# Patient Record
Sex: Female | Born: 1995 | Race: Black or African American | Hispanic: No | Marital: Single | Smoking: Never smoker
Health system: Southern US, Community
[De-identification: ages and names within clinical notes are randomized; demographics above are authoritative.]

## PROBLEM LIST (undated history)

## (undated) ENCOUNTER — Inpatient Hospital Stay (HOSPITAL_COMMUNITY): Payer: Self-pay

## (undated) DIAGNOSIS — Z87728 Personal history of other specified (corrected) congenital malformations of nervous system and sense organs: Secondary | ICD-10-CM

## (undated) DIAGNOSIS — J45909 Unspecified asthma, uncomplicated: Secondary | ICD-10-CM

## (undated) HISTORY — DX: Unspecified asthma, uncomplicated: J45.909

## (undated) HISTORY — DX: Personal history of other specified (corrected) congenital malformations of nervous system and sense organs: Z87.728

---
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
## 2015-03-24 ENCOUNTER — Encounter (HOSPITAL_COMMUNITY): Payer: Self-pay | Admitting: Emergency Medicine

## 2015-03-24 ENCOUNTER — Emergency Department (HOSPITAL_COMMUNITY)
Admission: EM | Admit: 2015-03-24 | Discharge: 2015-03-24 | Disposition: A | Discharge status: Home / Self Care | Payer: No Typology Code available for payment source | Attending: Emergency Medicine | Admitting: Emergency Medicine

## 2015-03-24 DIAGNOSIS — M545 Low back pain, unspecified: Secondary | ICD-10-CM

## 2015-03-24 DIAGNOSIS — Y998 Other external cause status: Secondary | ICD-10-CM | POA: Insufficient documentation

## 2015-03-24 DIAGNOSIS — S3992XA Unspecified injury of lower back, initial encounter: Secondary | ICD-10-CM | POA: Diagnosis not present

## 2015-03-24 DIAGNOSIS — Y92410 Unspecified street and highway as the place of occurrence of the external cause: Secondary | ICD-10-CM | POA: Insufficient documentation

## 2015-03-24 DIAGNOSIS — Z720 Tobacco use: Secondary | ICD-10-CM | POA: Insufficient documentation

## 2015-03-24 DIAGNOSIS — Y9389 Activity, other specified: Secondary | ICD-10-CM | POA: Insufficient documentation

## 2015-03-24 MED ORDER — IBUPROFEN 800 MG PO TABS
800.0000 mg | ORAL_TABLET | Freq: Once | ORAL | Status: AC
  Administered 2015-03-24: 800 mg via ORAL
  Filled 2015-03-24: qty 1

## 2015-03-24 MED ORDER — METHOCARBAMOL 500 MG PO TABS: 500.0000 mg | ORAL_TABLET | Freq: Two times a day (BID) | ORAL | Status: DC | PRN

## 2015-03-24 MED ORDER — NAPROXEN 250 MG PO TABS: 250.0000 mg | ORAL_TABLET | Freq: Two times a day (BID) | ORAL | Status: DC
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
## 2015-03-24 NOTE — ED Notes (Signed)
Pt c/o lower back pain, 7/10, denies numbness/tingling to same.
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
## 2020-07-10 NOTE — L&D Delivery Note (Signed)
Delivery Note Labor onset: 01/13/2021  Labor Onset Time: 0139 Complete dilation at 5:01 AM  FHR second stage Cat 1 that transitioned to 3, with terminal bradycardia Analgesia/Anesthesia intrapartum: Epidural  Guided pushing with strong maternal urge. Delivery of a viable female over mediolateral epis at [phone] to expedite delivery for terminal bradycardia. Fetal head delivered in LOA position.  Nuchal cord: none.  Meconium stained infant placed on maternal abd, dried, and tactile stim. Lusty cry noted w/ good tone. NICU team present for immediate eval, infant remained skin-to-skin with mother Cord double clamped after 2 min and cut by Father, Gerlean Ren.  Gerlean Ren present for birth.  Cord blood sample collected: Yes Arterial cord blood sample collected: artery collapsed, unable to collect  Placenta delivered Tomasa Blase, intact, with 3 VC.  Placenta to pathology. Uterine tone firm, bleeding small  Mediolateral epis repaired in standard fashion  Anesthesia: Epidural Repair: 2-0 Vicryl CT and 4-0 Vicryl SH QBL/EBL (mL): 250 Complications: terminal bradycardia APGAR: APGAR (1 MIN): 8   APGAR (5 MINS): 9   APGAR (10 MINS):   Mom to postpartum.  Baby to Couplet care / Skin to Skin. Baby Boy "[place]" parents request in-pt circ.   Roma Schanz MSN, CNM 01/13/2021, 7:40 AM

## 2020-07-30 DIAGNOSIS — Z3A16 16 weeks gestation of pregnancy: Secondary | ICD-10-CM | POA: Diagnosis not present

## 2020-07-30 DIAGNOSIS — N760 Acute vaginitis: Secondary | ICD-10-CM | POA: Diagnosis not present

## 2020-07-30 DIAGNOSIS — Z3402 Encounter for supervision of normal first pregnancy, second trimester: Secondary | ICD-10-CM | POA: Diagnosis not present

## 2020-07-30 DIAGNOSIS — Z113 Encounter for screening for infections with a predominantly sexual mode of transmission: Secondary | ICD-10-CM | POA: Diagnosis not present

## 2020-07-30 DIAGNOSIS — O3680X9 Pregnancy with inconclusive fetal viability, other fetus: Secondary | ICD-10-CM | POA: Diagnosis not present

## 2020-07-30 DIAGNOSIS — N926 Irregular menstruation, unspecified: Secondary | ICD-10-CM | POA: Diagnosis not present

## 2020-07-30 DIAGNOSIS — Z124 Encounter for screening for malignant neoplasm of cervix: Secondary | ICD-10-CM | POA: Diagnosis not present

## 2020-07-30 DIAGNOSIS — Z3482 Encounter for supervision of other normal pregnancy, second trimester: Secondary | ICD-10-CM | POA: Diagnosis not present

## 2020-07-30 LAB — OB RESULTS CONSOLE HEPATITIS B SURFACE ANTIGEN
Hepatitis B Surface Ag: NEGATIVE
Hepatitis B Surface Ag: NEGATIVE

## 2020-07-30 LAB — OB RESULTS CONSOLE RUBELLA ANTIBODY, IGM
Rubella: IMMUNE
Rubella: IMMUNE

## 2020-07-30 LAB — OB RESULTS CONSOLE HIV ANTIBODY (ROUTINE TESTING)
HIV: NONREACTIVE
HIV: NONREACTIVE

## 2020-07-30 LAB — OB RESULTS CONSOLE GC/CHLAMYDIA
Chlamydia: NEGATIVE
Gonorrhea: NEGATIVE

## 2020-07-30 LAB — OB RESULTS CONSOLE ABO/RH: RH Type: POSITIVE

## 2020-07-30 LAB — OB RESULTS CONSOLE ANTIBODY SCREEN: Antibody Screen: NEGATIVE

## 2020-07-30 LAB — OB RESULTS CONSOLE RPR: RPR: NONREACTIVE

## 2020-08-02 LAB — OB RESULTS CONSOLE GC/CHLAMYDIA
Chlamydia: NEGATIVE
Gonorrhea: NEGATIVE

## 2020-08-26 DIAGNOSIS — Z3492 Encounter for supervision of normal pregnancy, unspecified, second trimester: Secondary | ICD-10-CM | POA: Diagnosis not present

## 2020-08-26 DIAGNOSIS — Z3A20 20 weeks gestation of pregnancy: Secondary | ICD-10-CM | POA: Diagnosis not present

## 2020-08-26 DIAGNOSIS — Z369 Encounter for antenatal screening, unspecified: Secondary | ICD-10-CM | POA: Diagnosis not present

## 2020-08-26 DIAGNOSIS — O0932 Supervision of pregnancy with insufficient antenatal care, second trimester: Secondary | ICD-10-CM | POA: Diagnosis not present

## 2020-08-26 DIAGNOSIS — Z331 Pregnant state, incidental: Secondary | ICD-10-CM | POA: Diagnosis not present

## 2020-08-26 DIAGNOSIS — Z363 Encounter for antenatal screening for malformations: Secondary | ICD-10-CM | POA: Diagnosis not present

## 2020-10-21 DIAGNOSIS — Z3402 Encounter for supervision of normal first pregnancy, second trimester: Secondary | ICD-10-CM | POA: Diagnosis not present

## 2020-11-26 DIAGNOSIS — Z20822 Contact with and (suspected) exposure to covid-19: Secondary | ICD-10-CM | POA: Diagnosis not present

## 2020-12-14 DIAGNOSIS — Q056 Thoracic spina bifida without hydrocephalus: Secondary | ICD-10-CM | POA: Diagnosis not present

## 2020-12-14 DIAGNOSIS — O0932 Supervision of pregnancy with insufficient antenatal care, second trimester: Secondary | ICD-10-CM | POA: Diagnosis not present

## 2020-12-14 DIAGNOSIS — Z113 Encounter for screening for infections with a predominantly sexual mode of transmission: Secondary | ICD-10-CM | POA: Diagnosis not present

## 2020-12-14 DIAGNOSIS — Z331 Pregnant state, incidental: Secondary | ICD-10-CM | POA: Diagnosis not present

## 2020-12-14 DIAGNOSIS — Z369 Encounter for antenatal screening, unspecified: Secondary | ICD-10-CM | POA: Diagnosis not present

## 2020-12-14 LAB — OB RESULTS CONSOLE GBS: GBS: NEGATIVE

## 2020-12-27 DIAGNOSIS — Z3A38 38 weeks gestation of pregnancy: Secondary | ICD-10-CM | POA: Diagnosis not present

## 2020-12-27 DIAGNOSIS — O321XX9 Maternal care for breech presentation, other fetus: Secondary | ICD-10-CM | POA: Diagnosis not present

## 2021-01-11 ENCOUNTER — Other Ambulatory Visit: Payer: Self-pay | Admitting: Obstetrics and Gynecology

## 2021-01-12 ENCOUNTER — Encounter (HOSPITAL_COMMUNITY): Payer: Self-pay | Admitting: Obstetrics and Gynecology

## 2021-01-12 ENCOUNTER — Encounter (HOSPITAL_COMMUNITY): Payer: Self-pay | Admitting: Anesthesiology

## 2021-01-12 ENCOUNTER — Inpatient Hospital Stay (HOSPITAL_COMMUNITY): Payer: Medicaid Other | Admitting: Anesthesiology

## 2021-01-12 ENCOUNTER — Inpatient Hospital Stay (HOSPITAL_COMMUNITY)
Admission: RE | Admit: 2021-01-12 | Discharge: 2021-01-15 | DRG: 807 | Disposition: A | Discharge status: Home / Self Care | Payer: Medicaid Other | Attending: Obstetrics & Gynecology | Admitting: Obstetrics & Gynecology

## 2021-01-12 ENCOUNTER — Telehealth (HOSPITAL_COMMUNITY): Payer: Self-pay | Admitting: *Deleted

## 2021-01-12 ENCOUNTER — Encounter (HOSPITAL_COMMUNITY): Payer: Self-pay | Admitting: *Deleted

## 2021-01-12 DIAGNOSIS — O9952 Diseases of the respiratory system complicating childbirth: Secondary | ICD-10-CM | POA: Diagnosis not present

## 2021-01-12 DIAGNOSIS — Z3A40 40 weeks gestation of pregnancy: Secondary | ICD-10-CM | POA: Diagnosis not present

## 2021-01-12 DIAGNOSIS — O99334 Smoking (tobacco) complicating childbirth: Secondary | ICD-10-CM | POA: Diagnosis not present

## 2021-01-12 DIAGNOSIS — O99892 Other specified diseases and conditions complicating childbirth: Principal | ICD-10-CM | POA: Diagnosis present

## 2021-01-12 DIAGNOSIS — F17210 Nicotine dependence, cigarettes, uncomplicated: Secondary | ICD-10-CM | POA: Diagnosis not present

## 2021-01-12 DIAGNOSIS — Q760 Spina bifida occulta: Secondary | ICD-10-CM

## 2021-01-12 DIAGNOSIS — O99214 Obesity complicating childbirth: Secondary | ICD-10-CM | POA: Diagnosis present

## 2021-01-12 DIAGNOSIS — J45909 Unspecified asthma, uncomplicated: Secondary | ICD-10-CM | POA: Diagnosis not present

## 2021-01-12 DIAGNOSIS — O26893 Other specified pregnancy related conditions, third trimester: Secondary | ICD-10-CM | POA: Diagnosis not present

## 2021-01-12 LAB — CBC
HCT: 35.4 % — ABNORMAL LOW (ref 36.0–46.0)
Hemoglobin: 11.8 g/dL — ABNORMAL LOW (ref 12.0–15.0)
MCH: 27.3 pg (ref 26.0–34.0)
MCHC: 33.3 g/dL (ref 30.0–36.0)
MCV: 81.8 fL (ref 80.0–100.0)
Platelets: 272 10*3/uL (ref 150–400)
RBC: 4.33 MIL/uL (ref 3.87–5.11)
RDW: 14.3 % (ref 11.5–15.5)
WBC: 7.7 10*3/uL (ref 4.0–10.5)
nRBC: 0 % (ref 0.0–0.2)

## 2021-01-12 LAB — TYPE AND SCREEN
ABO/RH(D): A POS
Antibody Screen: NEGATIVE

## 2021-01-12 MED ORDER — FLEET ENEMA 7-19 GM/118ML RE ENEM: 1.0000 | ENEMA | RECTAL | Status: DC | PRN

## 2021-01-12 MED ORDER — PHENYLEPHRINE 40 MCG/ML (10ML) SYRINGE FOR IV PUSH (FOR BLOOD PRESSURE SUPPORT): 80.0000 ug | PREFILLED_SYRINGE | INTRAVENOUS | Status: DC | PRN

## 2021-01-12 MED ORDER — ONDANSETRON HCL 4 MG/2ML IJ SOLN
4.0000 mg | Freq: Four times a day (QID) | INTRAMUSCULAR | Status: DC | PRN
  Administered 2021-01-12: 4 mg via INTRAVENOUS
  Filled 2021-01-12: qty 2

## 2021-01-12 MED ORDER — LACTATED RINGERS IV SOLN
500.0000 mL | Freq: Once | INTRAVENOUS | Status: AC
  Administered 2021-01-12: 500 mL via INTRAVENOUS

## 2021-01-12 MED ORDER — SOD CITRATE-CITRIC ACID 500-334 MG/5ML PO SOLN: 30.0000 mL | ORAL | Status: DC | PRN

## 2021-01-12 MED ORDER — LACTATED RINGERS IV SOLN: 500.0000 mL | INTRAVENOUS | Status: DC | PRN

## 2021-01-12 MED ORDER — ACETAMINOPHEN 325 MG PO TABS: 650.0000 mg | ORAL_TABLET | ORAL | Status: DC | PRN

## 2021-01-12 MED ORDER — LACTATED RINGERS IV SOLN: INTRAVENOUS | Status: DC

## 2021-01-12 MED ORDER — OXYCODONE-ACETAMINOPHEN 5-325 MG PO TABS
2.0000 | ORAL_TABLET | ORAL | Status: DC | PRN
Start: 2021-01-12 — End: 2021-01-13

## 2021-01-12 MED ORDER — PHENYLEPHRINE 40 MCG/ML (10ML) SYRINGE FOR IV PUSH (FOR BLOOD PRESSURE SUPPORT)
80.0000 ug | PREFILLED_SYRINGE | INTRAVENOUS | Status: DC | PRN
  Filled 2021-01-12: qty 10

## 2021-01-12 MED ORDER — OXYTOCIN BOLUS FROM INFUSION
333.0000 mL | Freq: Once | INTRAVENOUS | Status: AC
  Administered 2021-01-13: 333 mL via INTRAVENOUS

## 2021-01-12 MED ORDER — LIDOCAINE HCL (PF) 1 % IJ SOLN: 30.0000 mL | INTRAMUSCULAR | Status: DC | PRN

## 2021-01-12 MED ORDER — DIPHENHYDRAMINE HCL 50 MG/ML IJ SOLN
12.5000 mg | INTRAMUSCULAR | Status: DC | PRN
  Administered 2021-01-13: 12.5 mg via INTRAVENOUS
  Filled 2021-01-12: qty 1

## 2021-01-12 MED ORDER — EPHEDRINE 5 MG/ML INJ: 10.0000 mg | INTRAVENOUS | Status: DC | PRN

## 2021-01-12 MED ORDER — OXYCODONE-ACETAMINOPHEN 5-325 MG PO TABS
1.0000 | ORAL_TABLET | ORAL | Status: DC | PRN
Start: 2021-01-12 — End: 2021-01-13

## 2021-01-12 MED ORDER — FENTANYL-BUPIVACAINE-NACL 0.5-0.125-0.9 MG/250ML-% EP SOLN
12.0000 mL/h | EPIDURAL | Status: DC | PRN
  Administered 2021-01-12: 12 mL/h via EPIDURAL
  Filled 2021-01-12: qty 250

## 2021-01-12 MED ORDER — OXYTOCIN-SODIUM CHLORIDE 30-0.9 UT/500ML-% IV SOLN
2.5000 [IU]/h | INTRAVENOUS | Status: DC
  Filled 2021-01-12: qty 500

## 2021-01-12 NOTE — MAU Note (Addendum)
Patient presented to [facility] with ctx reported starting 1400 today 7/6. States that they are currently 3-5 mins, with increased intensity 7-8/10 pain rating. Patient reports also lost her mucus plug yesterday 7/5. Denies VB, LOF, and DFM.

## 2021-01-12 NOTE — H&P (Addendum)
OB ADMISSION/ HISTORY & PHYSICAL:  Admission Date: 01/12/2021  7:38 PM  Admit Diagnosis: Normal labor  Caroline Ramos is a 25 y.o. female G1P0 [redacted]w[redacted]d presenting for labor eval. Endorses active FM, denies LOF and vaginal bleeding. Ctx began @ 1400 and continued to worsen. Pt sched for IOL @ 41 wks, will admit for labor.   History of current pregnancy: G1P0   Patient entered care with [facility] at 16+6 wks.   EDC 01/08/21 by LMP and congruent w/ 16+2 wk U/S.   Anatomy scan:  20+5 wks, complete w/ posterior placenta.   Antenatal testing: N/A Last evaluation: 38  wks U/S for presentation--vertex and AFI 18.5  Significant prenatal events:  Patient Active Problem List   Diagnosis Date Noted   Normal labor 01/12/2021   SBO (spina bifida occulta) 01/12/2021    Prenatal Labs: ABO, Rh: A/Positive/-- (01/21 0000) Antibody: Negative (01/21 0000) Rubella: Immune (01/21 0000)  RPR: Nonreactive (01/21 0000)  HBsAg: Negative (01/21 0000)  HIV: Non-reactive (01/21 0000)  GTT: passed 1 hr GBS:   neg GC/CHL: neg/neg Genetics: low-risk female, negative Horizon Tdap/influenza vaccines: declined both   OB History  Gravida Para Term Preterm AB Living  1            SAB IAB Ectopic Multiple Live Births               # Outcome Date GA Lbr Len/2nd Weight Sex Delivery Anes PTL Lv  1 Current             Medical / Surgical History: Past medical history:  Past Medical History:  Diagnosis Date   Asthma    History of spina bifida     Past surgical history: History reviewed. No pertinent surgical history. Family History:  Family History  Problem Relation Age of Onset   Breast cancer Mother    Hypertension Maternal Grandmother    Liver disease Maternal Grandmother     Social History:  reports that she has been smoking cigarettes. She has never used smokeless tobacco. She reports current alcohol use. She reports current drug use. Drug: Marijuana.  Allergies: Patient has no known allergies.    Current Medications at time of admission:  Prior to Admission medications   Medication Sig Start Date End Date Taking? Authorizing Provider  Prenatal Vit-Fe Fumarate-FA (PRENATAL MULTIVITAMIN) TABS tablet Take 1 tablet by mouth daily at 12 noon.   Yes [provider]  methocarbamol (ROBAXIN) 500 MG tablet Take 1 tablet (500 mg total) by mouth 2 (two) times daily as needed for muscle spasms. 03/24/15   Everlene Farrier, PA-C  naproxen (NAPROSYN) 250 MG tablet Take 1 tablet (250 mg total) by mouth 2 (two) times daily with a meal. 03/24/15   Everlene Farrier, PA-C    Review of Systems: Constitutional: Negative   HENT: Negative   Eyes: Negative   Respiratory: Negative   Cardiovascular: Negative   Gastrointestinal: Negative  Genitourinary: neg for bloody show, neg for LOF   Musculoskeletal: Negative   Skin: Negative   Neurological: Negative   Endo/Heme/Allergies: Negative   Psychiatric/Behavioral: Negative    Physical Exam: VS: Blood pressure 136/79, pulse 98, temperature 98.9 F (37.2 C), temperature source Oral, resp. rate 18, SpO2 100 %. AAO x3, no signs of distress Cardiovascular: RRR Respiratory: Lung fields clear to ausculation GU/GI: Abdomen gravid, non-tender, non-distended, active FM, vertex, EFW 7.5# per Leopold's Extremities: trace edema, negative for pain, tenderness, and cords  Cervical exam:Dilation: 2.5 Effacement (%): 90 Station: -2 Exam  by:: Burnadette Peter, RN FHR: baseline rate 135 / variability moderate / accelerations present / absent decelerations TOCO: 2-5 min   Prenatal Transfer Tool  Maternal Diabetes: No Genetic Screening: Normal Maternal Ultrasounds/Referrals: Normal Fetal Ultrasounds or other Referrals:  None Maternal Substance Abuse:  No Significant Maternal Medications:  None Significant Maternal Lab Results: Group B Strep negative    Assessment: 25 y.o. G1P0 [redacted]w[redacted]d  Latent stage of labor FHR category 1 GBS neg Pain management  plan: Epidural   Plan:  Admit to L&D Routine admission orders Epidural PRN  Dr Richardson Dopp notified of admission and plan of care  Roma Schanz MSN, CNM 01/12/2021 9:07 PM

## 2021-01-12 NOTE — Telephone Encounter (Signed)
Preadmission screen  

## 2021-01-13 ENCOUNTER — Other Ambulatory Visit: Payer: Self-pay

## 2021-01-13 ENCOUNTER — Encounter (HOSPITAL_COMMUNITY): Payer: Self-pay | Admitting: Obstetrics & Gynecology

## 2021-01-13 ENCOUNTER — Other Ambulatory Visit (HOSPITAL_COMMUNITY): Payer: Medicaid Other | Attending: Obstetrics and Gynecology

## 2021-01-13 DIAGNOSIS — Z3A40 40 weeks gestation of pregnancy: Secondary | ICD-10-CM | POA: Diagnosis not present

## 2021-01-13 LAB — RPR: RPR Ser Ql: NONREACTIVE

## 2021-01-13 MED ORDER — DIPHENHYDRAMINE HCL 50 MG/ML IJ SOLN
12.5000 mg | INTRAMUSCULAR | Status: DC | PRN
Start: 2021-01-13 — End: 2021-01-13

## 2021-01-13 MED ORDER — IBUPROFEN 600 MG PO TABS
600.0000 mg | ORAL_TABLET | Freq: Four times a day (QID) | ORAL | Status: DC
  Administered 2021-01-13 – 2021-01-15 (×8): 600 mg via ORAL
  Filled 2021-01-13 (×8): qty 1

## 2021-01-13 MED ORDER — ZOLPIDEM TARTRATE 5 MG PO TABS: 5.0000 mg | ORAL_TABLET | Freq: Every evening | ORAL | Status: DC | PRN

## 2021-01-13 MED ORDER — DIPHENHYDRAMINE HCL 25 MG PO CAPS: 25.0000 mg | ORAL_CAPSULE | Freq: Four times a day (QID) | ORAL | Status: DC | PRN

## 2021-01-13 MED ORDER — SIMETHICONE 80 MG PO CHEW: 80.0000 mg | CHEWABLE_TABLET | ORAL | Status: DC | PRN

## 2021-01-13 MED ORDER — ONDANSETRON HCL 4 MG/2ML IJ SOLN: 4.0000 mg | INTRAMUSCULAR | Status: DC | PRN

## 2021-01-13 MED ORDER — ACETAMINOPHEN 325 MG PO TABS
650.0000 mg | ORAL_TABLET | ORAL | Status: DC | PRN
  Administered 2021-01-14 – 2021-01-15 (×5): 650 mg via ORAL
  Filled 2021-01-13 (×5): qty 2

## 2021-01-13 MED ORDER — TETANUS-DIPHTH-ACELL PERTUSSIS 5-2.5-18.5 LF-MCG/0.5 IM SUSY: 0.5000 mL | PREFILLED_SYRINGE | Freq: Once | INTRAMUSCULAR | Status: DC

## 2021-01-13 MED ORDER — DIBUCAINE (PERIANAL) 1 % EX OINT: 1.0000 "application " | TOPICAL_OINTMENT | CUTANEOUS | Status: DC | PRN

## 2021-01-13 MED ORDER — SENNOSIDES-DOCUSATE SODIUM 8.6-50 MG PO TABS
2.0000 | ORAL_TABLET | ORAL | Status: DC
  Administered 2021-01-13 – 2021-01-14 (×2): 2 via ORAL
  Filled 2021-01-13 (×2): qty 2

## 2021-01-13 MED ORDER — PRENATAL MULTIVITAMIN CH
1.0000 | ORAL_TABLET | Freq: Every day | ORAL | Status: DC
  Administered 2021-01-13 – 2021-01-14 (×2): 1 via ORAL
  Filled 2021-01-13 (×2): qty 1

## 2021-01-13 MED ORDER — COCONUT OIL OIL
1.0000 "application " | TOPICAL_OIL | Status: DC | PRN
  Administered 2021-01-13: 1 via TOPICAL

## 2021-01-13 MED ORDER — FENTANYL-BUPIVACAINE-NACL 0.5-0.125-0.9 MG/250ML-% EP SOLN: 12.0000 mL/h | EPIDURAL | Status: DC | PRN

## 2021-01-13 MED ORDER — BENZOCAINE-MENTHOL 20-0.5 % EX AERO
1.0000 "application " | INHALATION_SPRAY | CUTANEOUS | Status: DC | PRN
  Administered 2021-01-14 (×2): 1 via TOPICAL
  Filled 2021-01-13 (×2): qty 56

## 2021-01-13 MED ORDER — ONDANSETRON HCL 4 MG PO TABS: 4.0000 mg | ORAL_TABLET | ORAL | Status: DC | PRN

## 2021-01-13 MED ORDER — LIDOCAINE HCL (PF) 1 % IJ SOLN
INTRAMUSCULAR | Status: DC | PRN
  Administered 2021-01-12: 8 mL via EPIDURAL

## 2021-01-13 MED ORDER — WITCH HAZEL-GLYCERIN EX PADS
1.0000 "application " | MEDICATED_PAD | CUTANEOUS | Status: DC | PRN
  Administered 2021-01-14: 1 via TOPICAL

## 2021-01-13 NOTE — Progress Notes (Signed)
Subjective:    Comfortable w/ epidural  Objective:    VS: BP 124/72   Pulse (!) 109   Temp 98.2 F (36.8 C) (Oral)   Resp 15   SpO2 98%  FHR : baseline 135 / variability moderate / accelerations present / absent decelerations Toco: contractions every 3-4 minutes  Membranes: AROM, clear Dilation: 8 Effacement (%): 90 Cervical Position: Posterior Station: 0 Presentation: Vertex Exam by:: Rhea Pink, CNM   Assessment/Plan:   25 y.o. G1P0 [redacted]w[redacted]d Spontaneous labor  Labor: Progressing normally Fetal Wellbeing:  Category I Pain Control:  Epidural I/D:   GBS neg Anticipated MOD:  NSVD  Roma Schanz MSN, CNM 01/13/2021 1:58 AM

## 2021-01-13 NOTE — Lactation Note (Signed)
This note was copied from a baby's chart. Lactation Consultation Note Attempted to see mom. RN stated mom is still being repaired.  Patient Name: Caroline Ramos [id number] Date: 01/13/2021   Age:25 hours  Maternal Data    Feeding    LATCH Score                    Lactation Tools Discussed/Used    Interventions    Discharge    Consult Status      Charyl Dancer 01/13/2021, 6:51 AM

## 2021-01-13 NOTE — Lactation Note (Signed)
This note was copied from a baby's chart. Lactation Consultation Note  Patient Name: Caroline Ramos [id number] Date: 01/13/2021 Reason for consult: Follow-up assessment Age:25 hours  P1, Baby cueing.  Assisted with latching in football position. Reviewed hand expression with good flow. Mother brought her personal pump flange and requested fitting.  Fitted size with manual pump to 27. Suggest checking once milk transitions. Answered questions.  Mother requested coconut oil which was provided. Mom made aware of O/P services, breastfeeding support groups, community resources, and our phone # for post-discharge questions.  Feed on demand with cues.  Goal 8-12+ times per day after first 24 hrs.  Place baby STS if not cueing.    Maternal Data Has patient been taught Hand Expression?: Yes Does the patient have breastfeeding experience prior to this delivery?: No  Feeding Mother's Current Feeding Choice: Breast Milk  LATCH Score Latch: Grasps breast easily, tongue down, lips flanged, rhythmical sucking.  Audible Swallowing: A few with stimulation  Type of Nipple: Everted at rest and after stimulation  Comfort (Breast/Nipple): Soft / non-tender  Hold (Positioning): Assistance needed to correctly position infant at breast and maintain latch.  LATCH Score: 8   Lactation Tools Discussed/Used Tools: Pump Breast pump type: Manual Reason for Pumping:  (flange fitting)  Interventions Interventions: Breast feeding basics reviewed;Assisted with latch;Skin to skin;Hand express;Breast compression;Adjust position;Support pillows;Position options;Coconut oil;Hand pump;Education  Discharge Pump: DEBP  Consult Status Consult Status: Follow-up Date: 01/14/21 Follow-up type: In-patient    Dahlia Byes [facility] 01/13/2021, 10:42 AM

## 2021-01-13 NOTE — Anesthesia Preprocedure Evaluation (Signed)
Anesthesia Evaluation  Patient identified by MRN, date of birth, ID band Patient awake    Reviewed: Allergy & Precautions, H&P , NPO status , Patient's Chart, lab work & pertinent test results, reviewed documented beta blocker date and time   Airway Mallampati: II  TM Distance: >3 FB Neck ROM: full    Dental no notable dental hx. (+) Teeth Intact, Dental Advisory Given   Pulmonary asthma ,    Pulmonary exam normal breath sounds clear to auscultation       Cardiovascular negative cardio ROS Normal cardiovascular exam Rhythm:regular Rate:Normal     Neuro/Psych negative neurological ROS  negative psych ROS   GI/Hepatic negative GI ROS, Neg liver ROS,   Endo/Other  Morbid obesity  Renal/GU negative Renal ROS  negative genitourinary   Musculoskeletal   Abdominal   Peds  Hematology negative hematology ROS (+)   Anesthesia Other Findings SBO (spina bifida occulta)  Reproductive/Obstetrics (+) Pregnancy                             Anesthesia Physical Anesthesia Plan  ASA: 3  Anesthesia Plan: Epidural   Post-op Pain Management:    Induction:   PONV Risk Score and Plan: 2 and Treatment may vary due to age or medical condition  Airway Management Planned: Natural Airway  Additional Equipment: None  Intra-op Plan:   Post-operative Plan:   Informed Consent: I have reviewed the patients History and Physical, chart, labs and discussed the procedure including the risks, benefits and alternatives for the proposed anesthesia with the patient or authorized representative who has indicated his/her understanding and acceptance.       Plan Discussed with: Anesthesiologist  Anesthesia Plan Comments:         Anesthesia Quick Evaluation

## 2021-01-13 NOTE — Lactation Note (Signed)
This note was copied from a baby's chart. Lactation Consultation Note  Patient Name: Caroline Ramos [id number] Date: 01/13/2021 Reason for consult: L&D Initial assessment Age:25 hours Baby cueing.  Baby latched with ease. Lactation will follow up on MBU. Feeding Mother's Current Feeding Choice: Breast Milk  LATCH Score Latch: Grasps breast easily, tongue down, lips flanged, rhythmical sucking.  Audible Swallowing: A few with stimulation  Type of Nipple: Everted at rest and after stimulation  Comfort (Breast/Nipple): Soft / non-tender  Hold (Positioning): Assistance needed to correctly position infant at breast and maintain latch.  LATCH Score: 8   Interventions Interventions: Assisted with latch;Skin to skin;Education  Consult Status Consult Status: Follow-up Date: 01/13/21 Follow-up type: In-patient    Dahlia Byes [facility] 01/13/2021, 7:38 AM

## 2021-01-13 NOTE — Anesthesia Procedure Notes (Signed)
Epidural Patient location during procedure: OB Start time: 01/12/2021 11:50 PM End time: 01/12/2021 11:55 PM  Staffing Anesthesiologist: Bethena Midget, MD  Preanesthetic Checklist Completed: patient identified, IV checked, site marked, risks and benefits discussed, surgical consent, monitors and equipment checked, pre-op evaluation and timeout performed  Epidural Patient position: sitting Prep: DuraPrep and site prepped and draped Patient monitoring: continuous pulse ox and blood pressure Approach: midline Location: L4-L5 Injection technique: LOR air  Needle:  Needle type: Tuohy  Needle gauge: 17 G Needle length: 9 cm and 9 Needle insertion depth: 8 cm Catheter type: closed end flexible Catheter size: 19 Gauge Catheter at skin depth: 13 cm Test dose: negative  Assessment Events: blood not aspirated, injection not painful, no injection resistance, no paresthesia and negative IV test

## 2021-01-13 NOTE — Anesthesia Postprocedure Evaluation (Signed)
Anesthesia Post Note  Patient: Caroline Ramos  Procedure(s) Performed: AN AD HOC LABOR EPIDURAL     Patient location during evaluation: Mother Baby Anesthesia Type: Epidural Level of consciousness: awake, awake and alert and oriented Pain management: pain level controlled Vital Signs Assessment: post-procedure vital signs reviewed and stable Respiratory status: spontaneous breathing, respiratory function stable and nonlabored ventilation Cardiovascular status: blood pressure returned to baseline and stable Postop Assessment: no headache, no backache, epidural receding, no apparent nausea or vomiting, able to ambulate and adequate PO intake Anesthetic complications: no   No notable events documented.  Last Vitals:  Vitals:   01/13/21 1317 01/13/21 1754  BP: 122/75 123/73  Pulse: (!) 102 (!) 111  Resp: 18 18  Temp: 36.7 C 36.9 C  SpO2:      Last Pain:  Vitals:   01/13/21 1930  TempSrc:   PainSc: 0-No pain   Pain Goal:                   Jennelle Human

## 2021-01-14 LAB — CBC
HCT: 31 % — ABNORMAL LOW (ref 36.0–46.0)
Hemoglobin: 10.1 g/dL — ABNORMAL LOW (ref 12.0–15.0)
MCH: 26.6 pg (ref 26.0–34.0)
MCHC: 32.6 g/dL (ref 30.0–36.0)
MCV: 81.8 fL (ref 80.0–100.0)
Platelets: 258 10*3/uL (ref 150–400)
RBC: 3.79 MIL/uL — ABNORMAL LOW (ref 3.87–5.11)
RDW: 14.5 % (ref 11.5–15.5)
WBC: 11.3 10*3/uL — ABNORMAL HIGH (ref 4.0–10.5)
nRBC: 0 % (ref 0.0–0.2)

## 2021-01-14 LAB — SURGICAL PATHOLOGY

## 2021-01-14 NOTE — Lactation Note (Signed)
This note was copied from a baby's chart. Lactation Consultation Note  Patient Name: Caroline Ramos [id number] Date: 01/14/2021 Reason for consult: Follow-up assessment Age:25 hours P1, Baby latched upon entering.  {Per parents stools have transitioned to green. Mother's states that her L nipple is tender.  No cracks or abrasions noted. Assisted with guiding baby deeper on breast and mother felt improved comfort. Provided mother with comfort gels for soothing after breastfeeding.   Answered questions and reviewed engorgement care.   Latch Latch: Grasps breast easily, tongue down, lips flanged, rhythmical sucking.  Audible Swallowing: A few with stimulation  Type of Nipple: Everted at rest and after stimulation  Comfort (Breast/Nipple): Filling, red/small blisters or bruises, mild/mod discomfort  Hold (Positioning): No assistance needed to correctly position infant at breast.  LATCH Score: 8   Lactation Tools Discussed/Used Tools: Pump;Comfort gels;Coconut oil  Interventions Interventions: Breast feeding basics reviewed;Assisted with latch;Comfort gels  Discharge Discharge Education: Engorgement and breast care Pump: DEBP  Consult Status Consult Status: Follow-up Date: 01/15/21 Follow-up type: In-patient    Dahlia Byes [facility] 01/14/2021, 9:53 AM

## 2021-01-14 NOTE — Progress Notes (Signed)
PPD# 1 SVD w/ mediolateral epis Information for the patient's newborn:  Olanda, Boughner [[id number]]  female   Baby Boy: Angus Palms Circumcision: desires in pt   S:   Reports feeling "good, a little sore" Tolerating PO fluid and solids No nausea or vomiting Bleeding is light Pain controlled with acetaminophen and ibuprofen (OTC) Up ad lib / ambulatory / voiding w/o difficulty Feeding: Breast    O:   VS: BP 114/66   Pulse 96   Temp 98.2 F (36.8 C) (Oral)   Resp 18   Ht 5\' 4"  (1.626 m)   Wt 99.8 kg   SpO2 100%   Breastfeeding Unknown   BMI 37.76 kg/m   LABS:  Recent Labs    01/12/21 2108 01/14/21 0547  WBC 7.7 11.3*  HGB 11.8* 10.1*  PLT 272 258   Blood type: --/--/A POS (07/06 2135) Rubella: Immune, Immune (01/21 0000)                      I&O: Intake/Output      07/07 0701 07/08 0700 07/08 0701 07/09 0700   I.V. (mL/kg) 58.4 (0.6)    Other 182    Total Intake(mL/kg) 240.4 (2.4)    Urine (mL/kg/hr)     Blood     Total Output     Net +240.4         Urine Occurrence 2 x      Physical Exam: Alert and oriented X3 Lungs: Clear and unlabored Heart: regular rate and rhythm / no mumurs Abdomen: soft, non-tender, non-distended  Fundus: firm, non-tender, U-1 Perineum: mediolateral epis well approximated Lochia: minimal Extremities: no edema, no calf pain or tenderness    A:  PPD # 1  Normal exam  P:  Routine post partum orders  Anticipate D/C on 01/15/21   Plan reviewed w/ Dr. 03/18/21, MSN, CNM 01/14/2021, 10:34 AM

## 2021-01-14 NOTE — Lactation Note (Addendum)
This note was copied from a baby's chart. Lactation Consultation Note  Patient Name: Caroline Ramos [id number] Date: 01/14/2021 Reason for consult: Follow-up assessment;Term Age:25 hours, term female infant, -3% weight loss. Infant had 4 voids and 4 stools. Per mom, infant recently BF for 25 minutes 1 hour prior to [facility] entering the room.  Mom feels breastfeeding is going well, infant is now breastfeeding 20 to 30 minutes most feeding, she is feeling on a tug with latch. Mom does have some soreness but no trauma noted on either breast. Mom will continue to BF infant according to feeding cues, infant has been cluster feeding today. LC reviewed hand expression and mom easily expressed 3 mls of colostrum , she plans to offer to infant after she breastfeed him at the next feeding by spoon.    Maternal Data    Feeding Mother's Current Feeding Choice: Breast Milk  LATCH Score                    Lactation Tools Discussed/Used    Interventions Interventions: Skin to skin;Hand express;Breast massage;Education;Expressed milk;Comfort gels  Discharge    Consult Status Consult Status: Follow-up Date: 01/15/21 Follow-up type: In-patient    Danelle Earthly 01/14/2021, 10:14 PM

## 2021-01-15 ENCOUNTER — Inpatient Hospital Stay (HOSPITAL_COMMUNITY): Payer: Medicaid Other

## 2021-01-15 ENCOUNTER — Inpatient Hospital Stay (HOSPITAL_COMMUNITY)
Admission: AD | Admit: 2021-01-15 | Payer: Medicaid Other | Source: Home / Self Care | Admitting: Obstetrics and Gynecology

## 2021-01-15 MED ORDER — IBUPROFEN 600 MG PO TABS: 600.0000 mg | ORAL_TABLET | Freq: Four times a day (QID) | ORAL | 0 refills | Status: DC

## 2021-01-15 NOTE — Lactation Note (Signed)
This note was copied from a baby's chart. Lactation Consultation Note  Patient Name: Caroline Ramos Date: 01/15/2021 Reason for consult: Follow-up assessment;Primapara;1st time breastfeeding Age:25 hours/ 3 % weight loss / serum Bilirubin at 0722 was 9.8 L/I .  Per mom the baby has been cluster feeding along with longer feedings.  Per mom breast are heavier. LC reassured mom that was a good sign.  Latch score below is from the [facility] early this shift.  LC reviewed the doc flow sheets/ already had been updated.  D/C BF teaching completed below.  LC provided the [facility] brochure with resources for BF PP.   Maternal Data Has patient been taught Hand Expression?: Yes  Feeding Mother's Current Feeding Choice: Breast Milk  LATCH Score - ( Latch by the [facility] )  Latch: Grasps breast easily, tongue down, lips flanged, rhythmical sucking.  Audible Swallowing: Spontaneous and intermittent  Type of Nipple: Everted at rest and after stimulation  Comfort (Breast/Nipple): Filling, red/small blisters or bruises, mild/mod discomfort (comfort gels)  Hold (Positioning): No assistance needed to correctly position infant at breast.  LATCH Score: 9   Lactation Tools Discussed/Used Tools: Shells;Comfort gels;Pump;Flanges Flange Size: 24;27 Breast pump type: Manual Pump Education: Milk Storage  Interventions Interventions: Breast feeding basics reviewed;Education  Discharge Discharge Education: Engorgement and breast care;Warning signs for feeding baby Pump: Personal;Manual;DEBP  Consult Status Consult Status: Complete Date: 01/15/21    Kathrin Greathouse 01/15/2021, 9:56 AM

## 2021-01-15 NOTE — Discharge Summary (Signed)
SVD OB Discharge Summary     Patient Name: Caroline Ramos DOB: Jul 01, 1996 MRN: [id number]  Date of admission: 01/12/2021 Delivering MD: Rhea Pink B  Date of delivery: 01/13/2021 Type of delivery: SVD  Newborn Data: Sex: Baby female Circumcision: done in pt  Live born female  Birth Weight: 7 lb 4.6 oz (3306 g) APGAR: 8, 9  Newborn Delivery   Birth date/time: 01/13/2021 06:16:00 Delivery type: Vaginal, Spontaneous      Feeding: breast Infant being discharge to home with mother in stable condition.   Admitting diagnosis: Normal labor [O80, Z37.9] Intrauterine pregnancy: [redacted]w[redacted]d     Secondary diagnosis:  Principal Problem:   SVD (7/7) Active Problems:   Normal labor   SBO (spina bifida occulta)   Normal postpartum course                                Complications: None                                                              Intrapartum Procedures: spontaneous vaginal delivery Postpartum Procedures: none Complications-Operative and Postpartum:  medio lateral episiotomy.  Augmentation: AROM   History of Present Illness: [name] is a 25 y.o. female, G1P1001, who presents at [redacted]w[redacted]d weeks gestation. The patient has been followed at  [facility]  Her pregnancy has been complicated by:  Patient Active Problem List   Diagnosis Date Noted   Normal postpartum course 01/15/2021   SVD (7/7) 01/13/2021   Normal labor 01/12/2021   SBO (spina bifida occulta) 01/12/2021     Active Ambulatory Problems    Diagnosis Date Noted   No Active Ambulatory Problems   Resolved Ambulatory Problems    Diagnosis Date Noted   No Resolved Ambulatory Problems   Past Medical History:  Diagnosis Date   Asthma    History of spina bifida      Hospital course:  Onset of Labor With Vaginal Delivery      25 y.o. yo G1P1001 at [redacted]w[redacted]d was admitted in Latent Labor on 01/12/2021. Patient had an uncomplicated labor course as follows:  Membrane Rupture  Time/Date: 1:39 AM ,01/13/2021   Delivery Method:Vaginal, Spontaneous  Episiotomy: Median  Lacerations:  None  Patient had an uncomplicated postpartum course.  She is ambulating, tolerating a regular diet, passing flatus, and urinating well. Patient is discharged home in stable condition on 01/15/21.  Newborn Data: Birth date:01/13/2021  Birth time:6:16 AM  Gender:Female  Living status:Living  Apgars:8 ,9  Weight:3306 g  Postpartum Day # 2 : S/P NSVD due to pt was admitted on 7/6 for early labor, progressed with AROM , has SVD on 7/7 @ 0616 over mediolateral episiotomy with repair, EBL was , hgb drop of 11.8-10.1. Patient up ad lib, denies syncope or dizziness. Reports consuming regular diet without issues and denies N/V. Patient reports 0 bowel movement + passing flatus.  Denies issues with urination and reports bleeding is "lighter."  Patient is breastfeeding and reports going well.  Desires undecided for postpartum contraception.  Pain is being appropriately managed with use of po meds.   Physical exam  Vitals:   01/14/21 0523 01/14/21 1415 01/14/21 2117 01/15/21 0536  BP:  114/66 119/80 120/72 118/80  Pulse: 96 92 93 89  Resp: 18 17 17 17   Temp: 98.2 F (36.8 C) 97.9 F (36.6 C) 98.7 F (37.1 C) 98.1 F (36.7 C)  TempSrc: Oral Oral Oral Oral  SpO2: 100% 100% 100% 100%  Weight:      Height:       General: alert, cooperative, and no distress Lochia: appropriate Uterine Fundus: firm Perineum: Approximate, no hematomas noted. DVT Evaluation: No evidence of DVT seen on physical exam. Negative Homan's sign. No cords or calf tenderness. No significant calf/ankle edema.  Labs: Lab Results  Component Value Date   WBC 11.3 (H) 01/14/2021   HGB 10.1 (L) 01/14/2021   HCT 31.0 (L) 01/14/2021   MCV 81.8 01/14/2021   PLT 258 01/14/2021   No flowsheet data found.  Date of discharge: 01/15/2021 Discharge Diagnoses: Term Pregnancy-delivered Discharge instruction: per After Visit  Summary and "Baby and Me Booklet".  After visit meds:   Activity:           unrestricted and pelvic rest Advance as tolerated. Pelvic rest for 6 weeks.  Diet:                routine Medications: PNV and Ibuprofen Postpartum contraception: Undecided Condition:  Pt discharge to home with baby in stable and condition  Meds: Allergies as of 01/15/2021   No Known Allergies      Medication List     STOP taking these medications    methocarbamol 500 MG tablet Commonly known as: ROBAXIN   naproxen 250 MG tablet Commonly known as: Naprosyn       TAKE these medications    ibuprofen 600 MG tablet Commonly known as: ADVIL Take 1 tablet (600 mg total) by mouth every 6 (six) hours.   prenatal multivitamin Tabs tablet Take 1 tablet by mouth daily at 12 noon.        Discharge Follow Up:   Follow-up Information     [facility]. Schedule an appointment as soon as possible for a visit in 6 week(s).   Specialty: Obstetrics and Gynecology Contact information: [address] [place] [phone]                 Eloy, NP-C, CNM 01/15/2021, 8:21 AM  03/18/2021, FNP

## 2021-01-26 ENCOUNTER — Telehealth (HOSPITAL_COMMUNITY): Payer: Self-pay

## 2021-01-26 NOTE — Telephone Encounter (Signed)
Follow-up discharge call.  Left message and call back number, provide resource information for [email or web address]

## 2021-02-24 DIAGNOSIS — Z308 Encounter for other contraceptive management: Secondary | ICD-10-CM | POA: Diagnosis not present

## 2021-03-04 DIAGNOSIS — O9122 Nonpurulent mastitis associated with the puerperium: Secondary | ICD-10-CM | POA: Diagnosis not present

## 2021-03-04 DIAGNOSIS — N644 Mastodynia: Secondary | ICD-10-CM | POA: Diagnosis not present

## 2021-05-17 DIAGNOSIS — Z3046 Encounter for surveillance of implantable subdermal contraceptive: Secondary | ICD-10-CM | POA: Diagnosis not present

## 2022-11-15 ENCOUNTER — Telehealth: Payer: Self-pay

## 2022-11-15 NOTE — Telephone Encounter (Signed)
LVM for patient to call back. AS, CMA 

## 2023-01-30 DIAGNOSIS — Z01419 Encounter for gynecological examination (general) (routine) without abnormal findings: Secondary | ICD-10-CM | POA: Diagnosis not present

## 2023-01-30 DIAGNOSIS — L292 Pruritus vulvae: Secondary | ICD-10-CM | POA: Diagnosis not present

## 2023-01-30 DIAGNOSIS — Z0000 Encounter for general adult medical examination without abnormal findings: Secondary | ICD-10-CM | POA: Diagnosis not present

## 2023-06-05 DIAGNOSIS — H6691 Otitis media, unspecified, right ear: Secondary | ICD-10-CM | POA: Diagnosis not present

## 2024-01-17 DIAGNOSIS — Z3046 Encounter for surveillance of implantable subdermal contraceptive: Secondary | ICD-10-CM | POA: Diagnosis not present

## 2024-04-29 DIAGNOSIS — Z113 Encounter for screening for infections with a predominantly sexual mode of transmission: Secondary | ICD-10-CM | POA: Diagnosis not present

## 2024-04-29 DIAGNOSIS — O219 Vomiting of pregnancy, unspecified: Secondary | ICD-10-CM | POA: Diagnosis not present

## 2024-04-29 DIAGNOSIS — L309 Dermatitis, unspecified: Secondary | ICD-10-CM | POA: Diagnosis not present

## 2024-04-29 DIAGNOSIS — Z331 Pregnant state, incidental: Secondary | ICD-10-CM | POA: Diagnosis not present

## 2024-04-29 DIAGNOSIS — Z369 Encounter for antenatal screening, unspecified: Secondary | ICD-10-CM | POA: Diagnosis not present

## 2024-05-21 DIAGNOSIS — Z331 Pregnant state, incidental: Secondary | ICD-10-CM | POA: Diagnosis not present

## 2024-05-21 DIAGNOSIS — Z131 Encounter for screening for diabetes mellitus: Secondary | ICD-10-CM | POA: Diagnosis not present

## 2024-05-21 DIAGNOSIS — Z1321 Encounter for screening for nutritional disorder: Secondary | ICD-10-CM | POA: Diagnosis not present

## 2024-05-21 DIAGNOSIS — Z3401 Encounter for supervision of normal first pregnancy, first trimester: Secondary | ICD-10-CM | POA: Diagnosis not present

## 2024-05-21 DIAGNOSIS — Z3A11 11 weeks gestation of pregnancy: Secondary | ICD-10-CM | POA: Diagnosis not present

## 2024-07-19 ENCOUNTER — Encounter (HOSPITAL_COMMUNITY): Payer: Self-pay

## 2024-07-19 ENCOUNTER — Other Ambulatory Visit: Payer: Self-pay

## 2024-07-19 ENCOUNTER — Inpatient Hospital Stay (HOSPITAL_COMMUNITY)
Admission: AD | Admit: 2024-07-19 | Discharge: 2024-07-19 | Disposition: A | Discharge status: Home / Self Care | Attending: Obstetrics and Gynecology | Admitting: Obstetrics and Gynecology

## 2024-07-19 DIAGNOSIS — O9A212 Injury, poisoning and certain other consequences of external causes complicating pregnancy, second trimester: Secondary | ICD-10-CM

## 2024-07-19 DIAGNOSIS — Z3A20 20 weeks gestation of pregnancy: Secondary | ICD-10-CM | POA: Diagnosis not present

## 2024-07-19 DIAGNOSIS — M549 Dorsalgia, unspecified: Secondary | ICD-10-CM | POA: Diagnosis present

## 2024-07-19 MED ORDER — ACETAMINOPHEN 500 MG PO TABS: 1000.0000 mg | ORAL_TABLET | Freq: Four times a day (QID) | ORAL | Status: AC | PRN

## 2024-07-19 MED ORDER — CYCLOBENZAPRINE HCL 10 MG PO TABS: 10.0000 mg | ORAL_TABLET | Freq: Three times a day (TID) | ORAL | 0 refills | Status: AC | PRN
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
